# Patient Record
Sex: Female | Born: 1935 | Race: White | Hispanic: No | Smoking: Never smoker
Health system: Southern US, Community
[De-identification: ages and names within clinical notes are randomized; demographics above are authoritative.]

## PROBLEM LIST (undated history)

## (undated) DIAGNOSIS — G459 Transient cerebral ischemic attack, unspecified: Secondary | ICD-10-CM

## (undated) DIAGNOSIS — E785 Hyperlipidemia, unspecified: Secondary | ICD-10-CM

## (undated) DIAGNOSIS — F419 Anxiety disorder, unspecified: Secondary | ICD-10-CM

## (undated) DIAGNOSIS — F0280 Dementia in other diseases classified elsewhere without behavioral disturbance: Secondary | ICD-10-CM

## (undated) DIAGNOSIS — G309 Alzheimer's disease, unspecified: Secondary | ICD-10-CM

## (undated) DIAGNOSIS — I10 Essential (primary) hypertension: Secondary | ICD-10-CM

## (undated) HISTORY — PX: CHOLECYSTECTOMY: SHX55

## (undated) HISTORY — PX: ABDOMINAL HYSTERECTOMY: SHX81

## (undated) HISTORY — PX: APPENDECTOMY: SHX54

---

## 1997-09-20 ENCOUNTER — Ambulatory Visit (HOSPITAL_COMMUNITY): Admission: RE | Admit: 1997-09-20 | Discharge: 1997-09-21 | Payer: Self-pay | Admitting: Cardiology

## 2004-02-24 ENCOUNTER — Ambulatory Visit: Payer: Self-pay | Admitting: *Deleted

## 2006-10-23 ENCOUNTER — Ambulatory Visit: Payer: Self-pay | Admitting: *Deleted

## 2007-08-31 ENCOUNTER — Ambulatory Visit: Payer: Self-pay | Admitting: Urology

## 2007-11-16 ENCOUNTER — Ambulatory Visit: Payer: Self-pay | Admitting: Family Medicine

## 2008-03-28 ENCOUNTER — Ambulatory Visit: Payer: Self-pay | Admitting: Family Medicine

## 2008-04-26 ENCOUNTER — Encounter: Payer: Self-pay | Admitting: Dermatology

## 2008-05-16 ENCOUNTER — Emergency Department: Payer: Self-pay | Admitting: Emergency Medicine

## 2008-05-23 ENCOUNTER — Encounter: Payer: Self-pay | Admitting: Dermatology

## 2008-06-20 ENCOUNTER — Encounter: Payer: Self-pay | Admitting: Dermatology

## 2008-09-01 ENCOUNTER — Ambulatory Visit: Payer: Self-pay | Admitting: Family Medicine

## 2008-09-22 ENCOUNTER — Emergency Department: Payer: Self-pay | Admitting: Internal Medicine

## 2008-09-23 ENCOUNTER — Ambulatory Visit: Payer: Self-pay | Admitting: Family Medicine

## 2009-02-04 IMAGING — US US EXTREM LOW VENOUS*R*
1 series · 17 of 23 positions shown · non-contrast
Comparison: none

REASON FOR EXAM: pain and swelling in lower leg
COMMENTS:

PROCEDURE:     US  - US DOPPLER LOW EXTR RIGHT  - May 16, 2008  [DATE]
RESULT:     Comparison: 03/28/2008

[Series 1: us extrem low venous*right* · 17 of 23 slices shown]
[im 1/23]
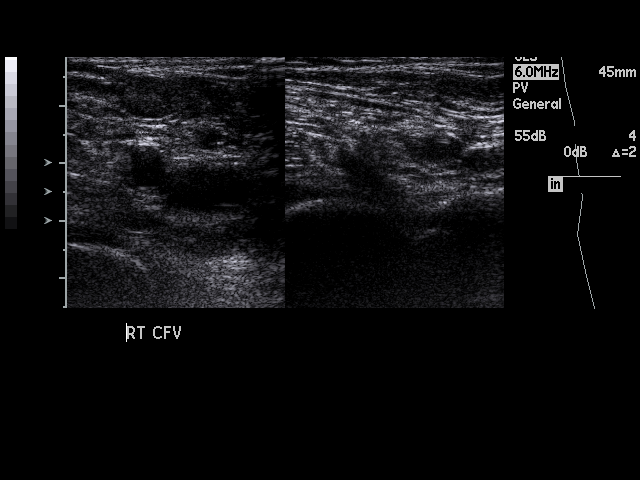
[im 3/23]
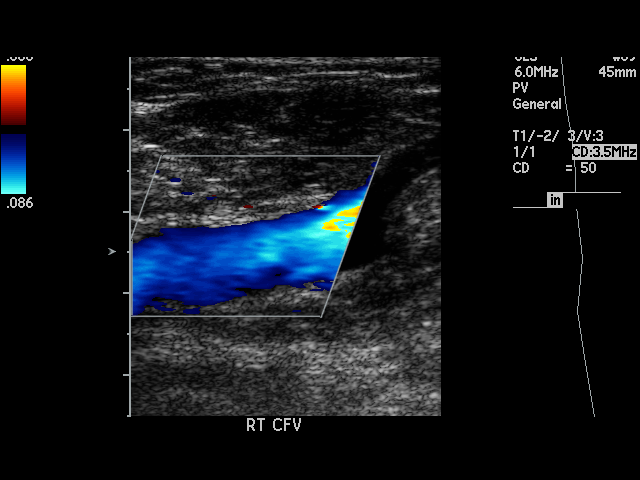
[im 4/23]
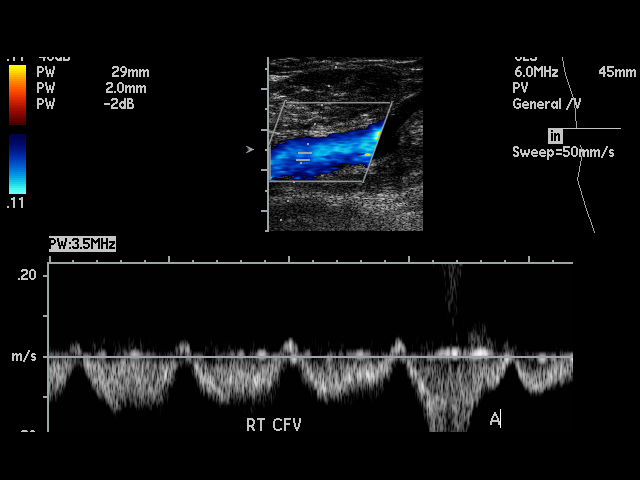
[im 5/23]
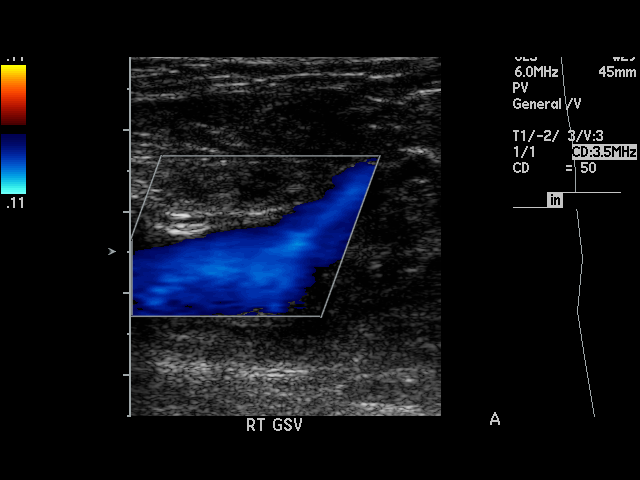
[im 7/23]
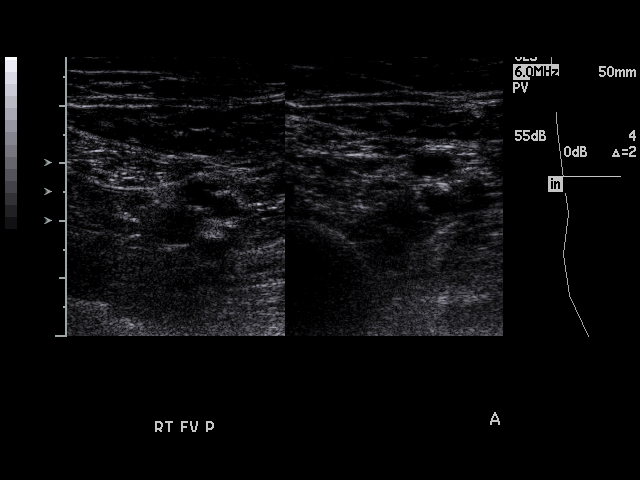
[im 8/23]
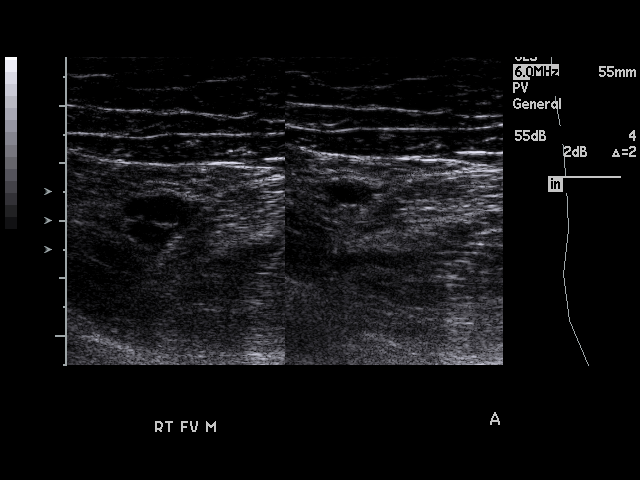
[im 9/23]
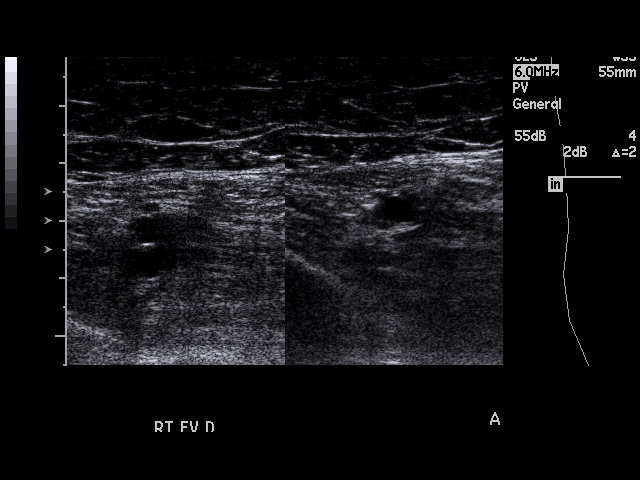
[im 11/23]
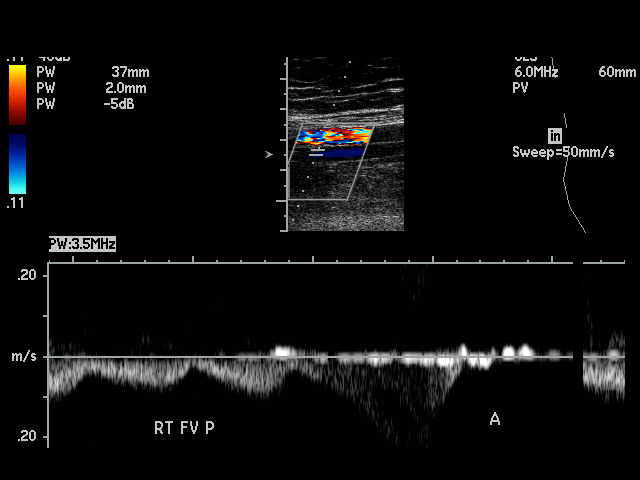
[im 12/23]
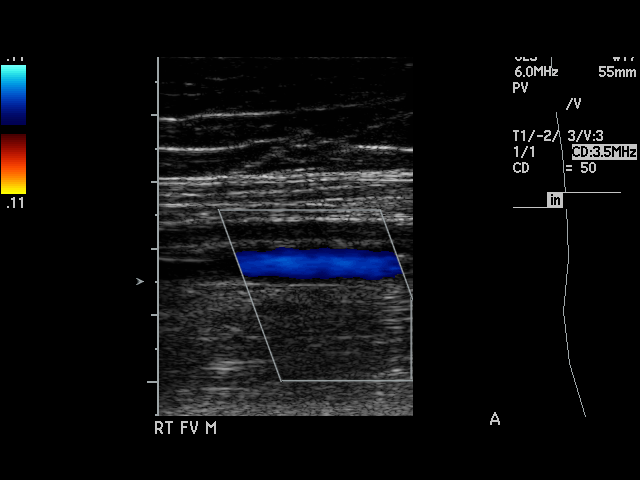
[im 13/23]
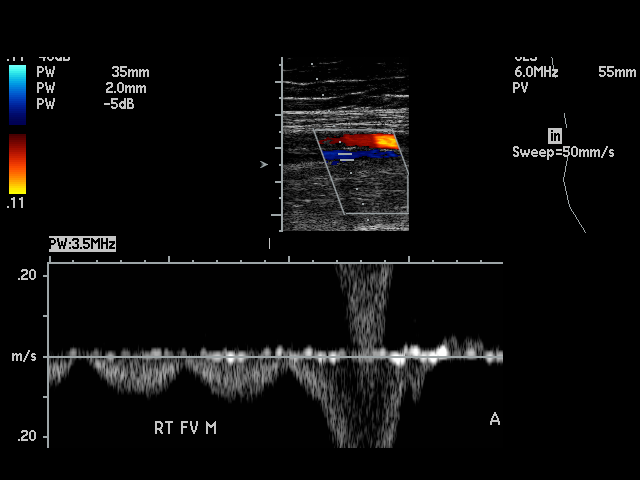
[im 15/23]
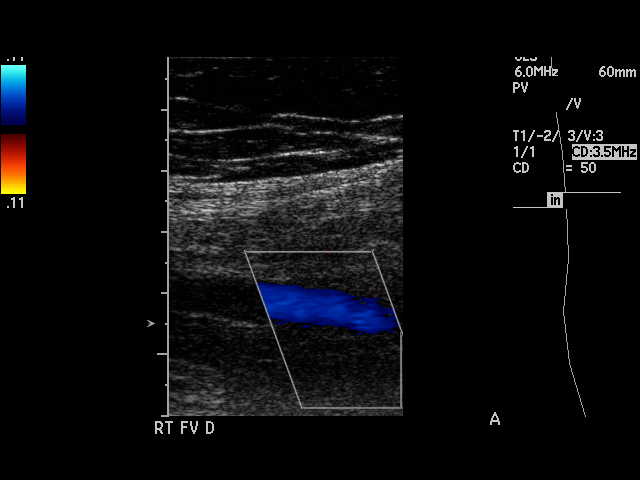
[im 16/23]
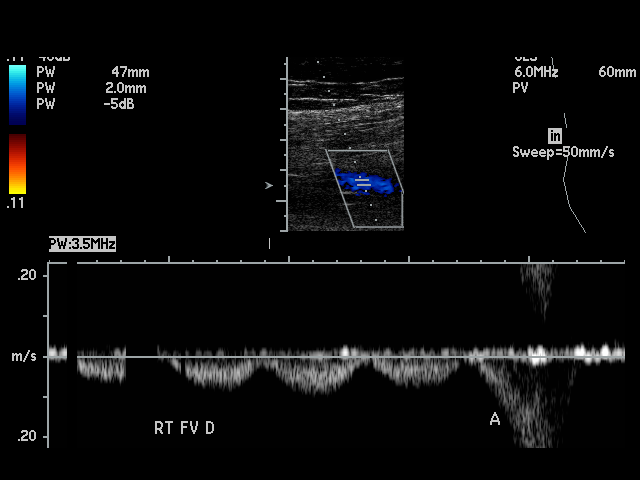
[im 17/23]
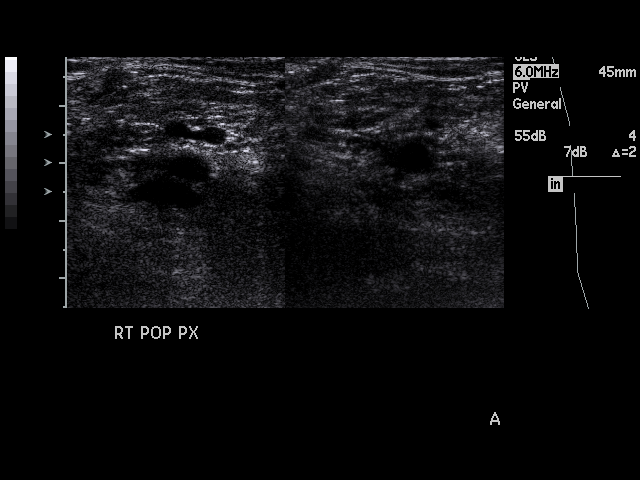
[im 19/23]
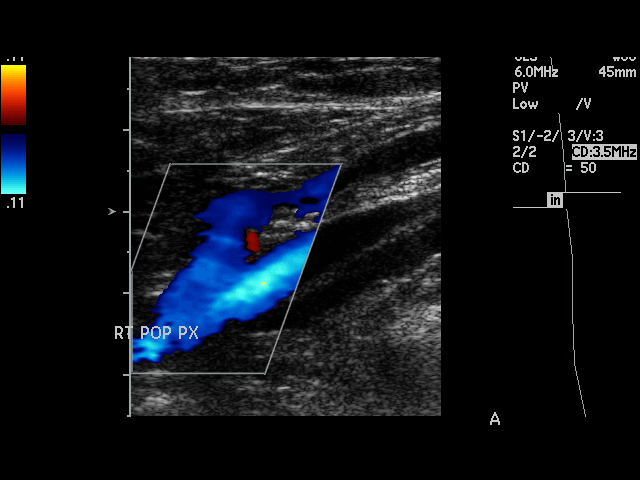
[im 20/23]
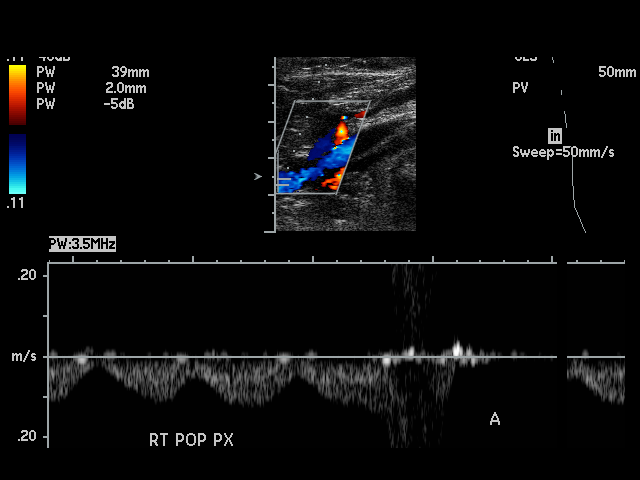
[im 21/23]
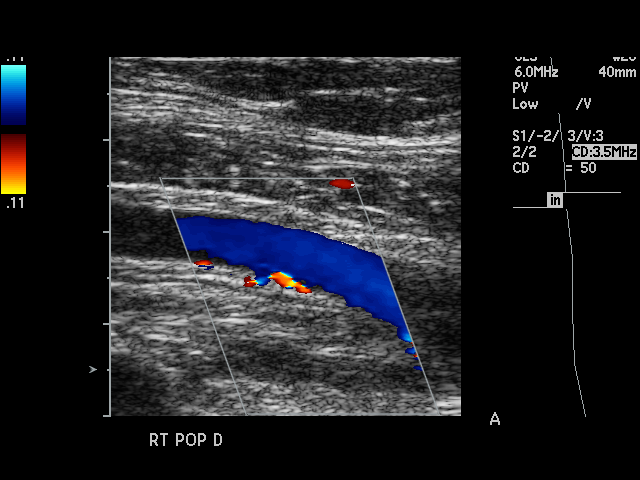
[im 23/23]
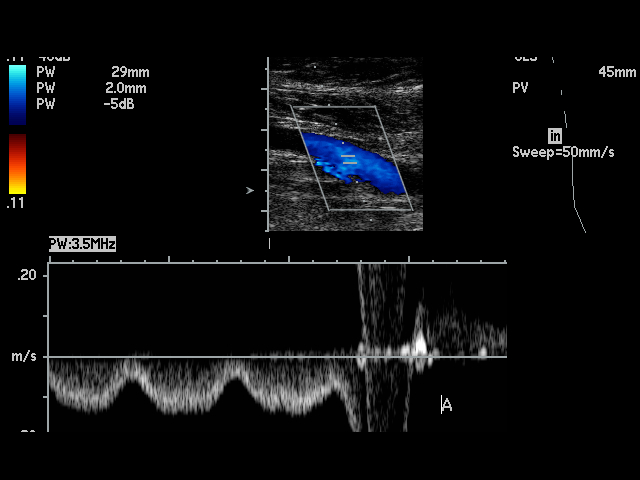

[17 of 23 positions shown; findings below may reference images not displayed]

FINDINGS: Multiple longitudinal and transverse gray-scale as well as color
and spectral Doppler images of the right lower extremity veins were obtained
from the common femoral veins through the popliteal veins.

The right common femoral, greater saphenous, femoral, popliteal veins, and
venous trifurcation are patent, demonstrating normal color-flow and
compressibility. No intraluminal thrombus is identified.There is normal
respiratory variation and augmentation demonstrated at all vein levels.
IMPRESSION: No evidence of DVT in the right lower extremity.

## 2010-04-19 ENCOUNTER — Emergency Department: Payer: Self-pay | Admitting: Emergency Medicine

## 2010-06-13 ENCOUNTER — Ambulatory Visit: Payer: Self-pay | Admitting: Nurse Practitioner

## 2010-06-19 ENCOUNTER — Ambulatory Visit: Payer: Self-pay | Admitting: Nurse Practitioner

## 2010-06-19 ENCOUNTER — Ambulatory Visit: Payer: Self-pay | Admitting: Emergency Medicine

## 2010-06-26 LAB — PATHOLOGY REPORT

## 2010-11-22 DIAGNOSIS — K219 Gastro-esophageal reflux disease without esophagitis: Secondary | ICD-10-CM | POA: Insufficient documentation

## 2011-09-28 DIAGNOSIS — R109 Unspecified abdominal pain: Secondary | ICD-10-CM | POA: Insufficient documentation

## 2011-11-26 ENCOUNTER — Observation Stay: Payer: Self-pay | Admitting: Internal Medicine

## 2011-11-26 LAB — URINALYSIS, COMPLETE
Ph: 8 (ref 4.5–8.0)
Protein: NEGATIVE
RBC,UR: 1 /HPF (ref 0–5)
Squamous Epithelial: 2

## 2011-11-26 LAB — COMPREHENSIVE METABOLIC PANEL
Albumin: 3.6 g/dL (ref 3.4–5.0)
Alkaline Phosphatase: 107 U/L (ref 50–136)
Anion Gap: 11 (ref 7–16)
BUN: 13 mg/dL (ref 7–18)
Calcium, Total: 9.6 mg/dL (ref 8.5–10.1)
Chloride: 107 mmol/L (ref 98–107)
EGFR (Non-African Amer.): >60
Glucose: 99 mg/dL (ref 65–99)
Osmolality: 281 (ref 275–301)
Potassium: 3.5 mmol/L (ref 3.5–5.1)
SGOT(AST): 48 U/L — ABNORMAL HIGH (ref 15–37)
Sodium: 141 mmol/L (ref 136–145)
Total Protein: 7.9 g/dL (ref 6.4–8.2)

## 2011-11-26 LAB — CBC
HGB: 13.4 g/dL (ref 12.0–16.0)
MCH: 30.6 pg (ref 26.0–34.0)
Platelet: 313 10*3/uL (ref 150–440)
RBC: 4.38 10*6/uL (ref 3.80–5.20)
RDW: 14.5 % (ref 11.5–14.5)
WBC: 9.3 10*3/uL (ref 3.6–11.0)

## 2011-11-26 LAB — CK TOTAL AND CKMB (NOT AT ARMC): CK-MB: <0.5 ng/mL — ABNORMAL LOW (ref 0.5–3.6)

## 2011-11-27 LAB — BASIC METABOLIC PANEL
Calcium, Total: 9 mg/dL (ref 8.5–10.1)
Chloride: 109 mmol/L — ABNORMAL HIGH (ref 98–107)
Co2: 24 mmol/L (ref 21–32)
Creatinine: 0.7 mg/dL (ref 0.60–1.30)
EGFR (African American): >60
Potassium: 3.1 mmol/L — ABNORMAL LOW (ref 3.5–5.1)
Sodium: 142 mmol/L (ref 136–145)

## 2011-11-27 LAB — CK TOTAL AND CKMB (NOT AT ARMC)
CK, Total: 49 U/L (ref 21–215)
CK-MB: 0.6 ng/mL (ref 0.5–3.6)
CK-MB: 0.7 ng/mL (ref 0.5–3.6)

## 2011-11-27 LAB — CBC WITH DIFFERENTIAL/PLATELET
Basophil #: 0.1 10*3/uL (ref 0.0–0.1)
Eosinophil #: 0 10*3/uL (ref 0.0–0.7)
MCH: 30.3 pg (ref 26.0–34.0)
MCHC: 34.2 g/dL (ref 32.0–36.0)
Monocyte #: 0.9 x10 3/mm (ref 0.2–0.9)
Neutrophil %: 62.6 %
Platelet: 273 10*3/uL (ref 150–440)
RDW: 14.1 % (ref 11.5–14.5)

## 2011-11-27 LAB — TROPONIN I: Troponin-I: <0.02 ng/mL

## 2013-02-12 DIAGNOSIS — F0280 Dementia in other diseases classified elsewhere without behavioral disturbance: Secondary | ICD-10-CM | POA: Insufficient documentation

## 2013-07-26 DIAGNOSIS — I340 Nonrheumatic mitral (valve) insufficiency: Secondary | ICD-10-CM | POA: Insufficient documentation

## 2013-07-26 DIAGNOSIS — I071 Rheumatic tricuspid insufficiency: Secondary | ICD-10-CM | POA: Insufficient documentation

## 2013-08-16 ENCOUNTER — Ambulatory Visit: Payer: Self-pay | Admitting: Gastroenterology

## 2013-08-30 ENCOUNTER — Ambulatory Visit: Payer: Self-pay | Admitting: Gastroenterology

## 2013-09-01 LAB — PATHOLOGY REPORT

## 2013-09-20 ENCOUNTER — Ambulatory Visit: Payer: Self-pay | Admitting: Gastroenterology

## 2014-08-09 NOTE — Discharge Summary (Signed)
PATIENT NAME:  Lynn Jimenez, Lynn Jimenez MR#:  [id number] DATE OF BIRTH:  [birth year]  DATE OF ADMISSION:  11/26/2011 DATE OF DISCHARGE:  11/27/2011  ADMITTING DIAGNOSIS: Worsening dizziness.   DISCHARGE DIAGNOSES:  1. Dizziness, possible benign positional vertigo. Patient has had dizziness for the past one year. Magnetic resonance imaging was negative. Needs further evaluation with ear, nose throat as outpatient.  2. Chest pain with reproducible component. Had a negative stress test in 2011. Cardiac enzymes x3 negative. Electrocardiogram without any acute changes.  3. History of coronary artery disease with myocardial infarction.  4. Hypertension.  5. History of dementia.  6. Gastroesophageal reflux disease.   7. Depression.  8. Hyperlipidemia.  9. Anxiety.   LABORATORY, DIAGNOSTIC AND RADIOLOGICAL DATA: Urinalysis showed trace bacteria, trace leukocytes. CT scan of the head showed no acute abnormality. EKG showed no ST-T wave changes. Troponin less than 0.02, CK-MB less than 0.5, CPK 50, troponin x3 was negative. WBC 9.3, hemoglobin 13.4, platelet count 313, sodium 141, potassium 3.5, chloride 107, bicarbonate 23, BUN 13, creatinine 0.70, glucose 99. LFTs were normal. Chest x-ray showed no acute abnormality. MRI of the brain showed no mass, no lesions. Chronic white matter ischemic changes. Bilateral carotid ultrasound showed plaque in the internal carotid on the right demonstrating less than 50% stenosis. Plaque also on the left internal carotid with less than 50% stenosis.   HOSPITAL COURSE: Please refer to history and physical done by the admitting physician. Patient is a 79 year old white female with history of hypertension, dementia, has had dizziness ongoing for about one year. Has been seen by ENT as outpatient. Patient presented to her primary care with these symptoms. She was also having some left-sided chest pain which was reproducible in nature, was sent to ED for evaluation. She was placed under  observation for further evaluation of her symptoms in terms of her dizziness. Patient had a carotid ultrasound results as above. Had MRI of her brain which was negative. She still has some symptoms. She needs to follow up outpatient with ENT. Patient has been started on meclizine as needed for dizziness. In terms of her chest pain, was very atypical in nature. She has had a stress test negative in 2011. She had reproducible pain. Her cardiac enzymes, EKG showed no acute abnormality. If she continues to experience chest pain needs outpatient evaluation with her cardiologist. At this time patient is stable and will need further evaluation with her primary Jimenez.D.   Kendell BaneISCHARGE MEDICATIONS:  1. Lorazepam 0.5, 1 tab p.o. t.i.d. as needed.  2. Meclizine 12.5, 1 tab p.o. q.8 p.r.n.  3. Imdur 30 daily.  4. Amantadine 10 daily.   5. Metoprolol tartrate 25, 1 tab p.o. b.i.d.  6. Nitrostat as needed.  7. Protonix 40, 1 tab p.o. b.i.d.  8. Effexor-XR 37.5, 1 tab p.o. daily.  9. Aspirin 81, 1 tab p.o. daily.  10. Lipitor 40 daily.  11. Celexa 20 daily.  12. Hydrochlorothiazide 25 p.o. daily.  13. Astelin one spray nasal once daily.  HOME OXYGEN: None.   DIET: Low sodium.   ACTIVITY: As tolerated.   FOLLOW UP: Follow up with primary Jimenez.D., Dr. Rolm GalaHeidi Grandis, in 1 to 2 weeks. Follow up with ENT in 2 to 4 weeks. Patient already has an ENT physician that she has seen in the past.  TIME SPENT: 30 minutes.   ____________________________ Lacie ScottsShreyang H. Allena KatzPatel, MD shp:cms D: 11/27/2011 14:10:33 ET T: 11/28/2011 10:03:15 ET  JOB#: [id number] cc: Sherrina Zaugg H. Allena KatzPatel, MD, <Dictator> Heidi  Lorin Glass, MD Charise Carwin MD ELECTRONICALLY SIGNED 11/28/2011 13:44

## 2014-08-09 NOTE — H&P (Signed)
PATIENT NAME:  Lynn HorsemanRAINEY, Lynn M MR#:  [id number] DATE OF BIRTH:  04/03/1936  DATE OF ADMISSION:  11/26/2011  PRIMARY CARE PHYSICIAN: Dr. Rolm GalaHeidi Grandis  ER PHYSICIAN: Dr. Carollee MassedKaminski   CHIEF COMPLAINT: Dizziness.   HISTORY OF PRESENT ILLNESS: Patient is a 79 year old female patient with history of hypertension, dementia, came in because of dizziness. [facility] [place] and she was sent here. Patient says that dizziness is going on for about a year but gotten worse recently. She feels dizzy more when she gets up and moves her head and when she lies down on the bed without turning her head she doesn't  feel that much. She just feels funny in her head but unable to tell me what it is. Denies any blurred vision and feels some headache. Patient also denies any weakness in hands or legs. Did not pass out so dizziness is really waxing and waning type. Denies any ear pain or recent ear infection and sometimes she feels like nauseous and feels swimmy headed. No aggravating or relieving factor for the dizziness. Patient complained of some left-sided chest pain to the ER physician. We were asked to admit for chest pain and also vertigo. Patient denies any trouble breathing.   PAST MEDICAL HISTORY:  1. History of coronary artery disease and MI before. 2. Hypertension. 3. History of dementia.  4. Gastroesophageal reflux disease. 5. Depression. 6. Hyperlipidemia. 7. Anxiety.   ALLERGIES: She is allergic to sulfa.    SOCIAL HISTORY: No smoking. No drinking. Has a caregiver at home.   PAST SURGICAL HISTORY: No history of previous surgeries.    MEDICATIONS:  1. Aspirin 81 mg daily.  2. Lipitor 40 mg daily. 3. Astelin 137 mcg spray daily.  4. Celexa 20 mg daily. 5. HCTZ 1 tablet 25 mg p.o. daily.  6. Metoprolol 25 mg p.o. b.i.d.  7. Namenda 10 mg p.o. daily.  8. Ativan 1 mg half to 1 tablet up to 3 times a day as needed. 9. Imdur 30 mg p.o. daily.  10. Nitroglycerin  0.4 mg sublingual p.r.n. for chest pain.  11. Pantoprazole 40 mg p.o. daily.  12. Venlafaxine 37.5 mg p.o. daily.  13. Reglan 10 mg p.o. as needed.   REVIEW OF SYSTEMS: CONSTITUTIONAL: No fever. No fatigue. EYES: No blurred vision. Patient has no epistaxis. No difficulty swallowing. RESPIRATORY: No cough. No wheezing. CARDIOVASCULAR: Pain on the left side around the breast area but denies any trouble breathing. Patient has no orthopnea, PND, no pedal edema. GASTROINTESTINAL: No nausea. No vomiting. No abdominal pain. GENITOURINARY: No dysuria. ENDOCRINE: No polyuria, nocturia. INTEGUMENT: No skin rashes. MUSCULOSKELETAL: No joint pain. NEUROLOGIC: Feels dizziness but no weakness or dysarthria. History of dementia present.    PHYSICAL EXAMINATION:  VITAL SIGNS: Temperature 97 Fahrenheit, pulse 76, respirations 20, blood pressure 140/91, sats 97% on room air.   GENERAL: 79 year old female patient answering questions appropriately, has no distress.   HEENT: Head atraumatic, normocephalic. Pupils equally reacting to light. Extraocular movements are intact. No conjunctivitis. No scleral icterus. Hearing is intact. No oropharyngeal erythema. Tympanic membranes are clear.   NECK: Thyroid nontender. No nodules are observed. Supple. No lymphadenopathy. No JVD. No carotid bruits.   RESPIRATORY: Clear to auscultation. No wheeze. No rales.   CARDIOVASCULAR: S1 and S2 regular. No murmurs. Patient has no carotid bruit on neck examination.   ABDOMEN: Soft, nontender, nondistended. Bowel sounds present.   EXTREMITIES: No extremity edema. Strength 5/5 upper and lower extremities.  SKIN: No skin rashes.   LYMPH NODES: No lymphadenopathy in cervical or axillary region.   NEUROLOGIC: Cranial nerves intact. Deep tendon reflexes 2+ bilaterally. No dysarthria or aphasia. Sensations are intact. Power 5/5 in upper and lower extremities.   PSYCH: Alert, oriented to time, place, person.   LABORATORY,  DIAGNOSTIC AND RADIOLOGICAL DATA: Urinalysis is yellow color hazy urine, trace bacteria and trace leukocyte esterase. CT head showed no CT evidence of acute intracranial abnormality, probably chronic small vessel ischemic changes with mild atrophy, no significant interval changes. Troponin less than 0.02. CPK-MB less than 0.5. CK total 50. WBC 9.3, hemoglobin 13.4, hematocrit 39, platelets 313. Electrolytes: Sodium 141, potassium 3.5, chloride 107, bicarbonate 23, BUN 13, creatinine 0.70, glucose 99. LFTs within normal limits. Chest x-ray showed no acute cardiopulmonary disease.   EKG compared to the one at [facility] and the one in the ER has no significant changes. Normal sinus rhythm and no ST-T changes.   ASSESSMENT AND PLAN: 79 year old female with:  1. Dizziness mostly positional but because of her age we need to rule out cerebellar stroke. We are going to get MRI of the brain along with carotid ultrasound and echocardiogram and put her on meclizine and see how she does and also check orthostatic vitals. Patient also will be seen by physical therapist regarding her ambulatory dysfunction. According to the son she is mostly sitting in the chair and has not been active and has been sedentary for almost one year.  2. Chest pain. Patient's troponin is negative. EKG no changes. Had a stress test in 2011 with normal stress test. Patient says that chest pain is not associated with trouble breathing, palpitations or nausea so high her troponin is also negative. Will cycle two more sets of troponins and follow the echocardiogram.  3. Urinary tract infection by urinalysis. Will start her on Cipro and just follow the cultures.  4. Hypertension. Continue her home medications.  5. For dementia she is on Namenda, continue that.  6. For hyperlipidemia she is on statins. Continue Lipitor 40 mg daily. We are going to stop the HCTZ because of her dizziness. 7. History of depression and anxiety and  dementia. Continue Celexa, Effexor and also Ativan.   TIME SPENT ON HISTORY AND PHYSICAL: About 60 minutes.   ____________________________ Katha Hamming, MD sk:cms D: 11/26/2011 20:44:58 ET T: 11/27/2011 07:02:22 ET JOB#: [id number]  cc: Katha Hamming, MD, <Dictator> Letitia Caul, MD Katha Hamming MD ELECTRONICALLY SIGNED 12/06/2011 13:17

## 2015-11-06 ENCOUNTER — Other Ambulatory Visit: Payer: Self-pay | Admitting: Family Medicine

## 2015-11-06 DIAGNOSIS — N644 Mastodynia: Secondary | ICD-10-CM

## 2015-11-06 DIAGNOSIS — Z1231 Encounter for screening mammogram for malignant neoplasm of breast: Secondary | ICD-10-CM

## 2015-11-06 DIAGNOSIS — R928 Other abnormal and inconclusive findings on diagnostic imaging of breast: Secondary | ICD-10-CM

## 2015-11-21 ENCOUNTER — Ambulatory Visit: Payer: Self-pay

## 2015-11-27 ENCOUNTER — Other Ambulatory Visit: Payer: Self-pay | Admitting: Family Medicine

## 2015-11-27 ENCOUNTER — Ambulatory Visit
Admission: RE | Admit: 2015-11-27 | Discharge: 2015-11-27 | Disposition: A | Discharge status: Home / Self Care | Payer: Medicare Other | Source: Ambulatory Visit | Attending: Family Medicine | Admitting: Family Medicine

## 2015-11-27 DIAGNOSIS — Z1231 Encounter for screening mammogram for malignant neoplasm of breast: Secondary | ICD-10-CM

## 2017-07-04 DIAGNOSIS — F3342 Major depressive disorder, recurrent, in full remission: Secondary | ICD-10-CM | POA: Insufficient documentation

## 2017-07-04 DIAGNOSIS — R4189 Other symptoms and signs involving cognitive functions and awareness: Secondary | ICD-10-CM | POA: Insufficient documentation

## 2017-07-30 DIAGNOSIS — G459 Transient cerebral ischemic attack, unspecified: Secondary | ICD-10-CM

## 2017-07-30 HISTORY — DX: Transient cerebral ischemic attack, unspecified: G45.9

## 2017-08-01 DIAGNOSIS — I639 Cerebral infarction, unspecified: Secondary | ICD-10-CM | POA: Insufficient documentation

## 2017-08-11 DIAGNOSIS — Z8673 Personal history of transient ischemic attack (TIA), and cerebral infarction without residual deficits: Secondary | ICD-10-CM | POA: Insufficient documentation

## 2017-08-29 ENCOUNTER — Other Ambulatory Visit: Payer: Self-pay

## 2017-08-29 ENCOUNTER — Encounter: Payer: Self-pay | Admitting: Emergency Medicine

## 2017-08-29 ENCOUNTER — Ambulatory Visit
Admission: EM | Admit: 2017-08-29 | Discharge: 2017-08-29 | Disposition: A | Discharge status: Home / Self Care | Payer: Medicare Other | Attending: Family Medicine | Admitting: Family Medicine

## 2017-08-29 DIAGNOSIS — N3090 Cystitis, unspecified without hematuria: Secondary | ICD-10-CM | POA: Diagnosis not present

## 2017-08-29 DIAGNOSIS — R350 Frequency of micturition: Secondary | ICD-10-CM | POA: Diagnosis not present

## 2017-08-29 DIAGNOSIS — R351 Nocturia: Secondary | ICD-10-CM | POA: Diagnosis not present

## 2017-08-29 DIAGNOSIS — R300 Dysuria: Secondary | ICD-10-CM | POA: Diagnosis not present

## 2017-08-29 HISTORY — DX: Essential (primary) hypertension: I10

## 2017-08-29 HISTORY — DX: Hyperlipidemia, unspecified: E78.5

## 2017-08-29 HISTORY — DX: Transient cerebral ischemic attack, unspecified: G45.9

## 2017-08-29 HISTORY — DX: Anxiety disorder, unspecified: F41.9

## 2017-08-29 HISTORY — DX: Dementia in other diseases classified elsewhere without behavioral disturbance: F02.80

## 2017-08-29 HISTORY — DX: Alzheimer's disease, unspecified: G30.9

## 2017-08-29 LAB — URINALYSIS, COMPLETE (UACMP) WITH MICROSCOPIC
BILIRUBIN URINE: NEGATIVE
GLUCOSE, UA: NEGATIVE mg/dL
Ketones, ur: NEGATIVE mg/dL
NITRITE: NEGATIVE
PH: 8.5 — AB (ref 5.0–8.0)
Protein, ur: 100 mg/dL — AB
Specific Gravity, Urine: 1.015 (ref 1.005–1.030)

## 2017-08-29 MED ORDER — CEPHALEXIN 500 MG PO CAPS: 500.0000 mg | ORAL_CAPSULE | Freq: Two times a day (BID) | ORAL | 0 refills | Status: DC

## 2017-08-29 NOTE — Discharge Instructions (Signed)
Antibiotic as prescribed.  Take care  Dr. Alvera Tourigny  

## 2017-08-29 NOTE — ED Provider Notes (Signed)
[facility]    CSN: [id number] Arrival date & time: 08/29/17  0911  History   Chief Complaint Chief Complaint  Patient presents with  . Dysuria   HPI  82 year old female who presents for suspected UTI.  Caregiver is the primary historian due to dementia.  Caregiver reports that she had urinary frequency yesterday and also had nocturia last night.  She states that this is often the first sign that she has urinary tract infection.  She states that she had one time where the patient reported dysuria.  No reports of hematuria.  No reports of abdominal pain.  No fever.  No known exacerbating relieving factors.  No other associated symptoms.  No other complaints.  PMH: Esophageal reflux 11/22/2010   Depression    Hyperlipidemia    Hypertension    CAD (coronary artery disease)    Dementia    Anxiety  chronic benzo use.  Alzheimer's dementia 02/12/2013   Mitral insufficiency 07/26/2013 Moderate per Gwen Pounds 4/15  Tricuspid insufficiency 07/26/2013   HH (hiatus hernia) 08/16/2013   Chronic gastritis 08/16/2013    Surgical Hx: BTL      HYSTERECTOMY      CHOLECYSTECTOMY      APPENDECTOMY      VEIN LIGATION AND STRIPPING      COLONOSCOPY 08/16/2013  Entire examined colon is normal/Repeat 83yrs/MUS   EGD 08/16/2013  Chronic gastritis    OB History   None    Home Medications    Prior to Admission medications   Medication Sig Start Date End Date Taking? Authorizing Provider  aspirin EC 81 MG tablet Take 1 tablet by mouth daily.   Yes [provider]  atorvastatin (LIPITOR) 40 MG tablet Take 1 tablet by mouth daily. 08/02/17 09/01/17 Yes [provider]  citalopram (CELEXA) 20 MG tablet Take 1 tablet by mouth daily. 07/04/17 07/05/18 Yes [provider]  donepezil (ARICEPT) 10 MG tablet Take 1 tablet by mouth daily. 06/17/16  Yes [provider]  isosorbide mononitrate (IMDUR) 30 MG 24 hr tablet Take 1 tablet  by mouth daily. 07/04/17 04/02/18 Yes [provider]  LORazepam (ATIVAN) 1 MG tablet Take 1 tablet by mouth 2 (two) times daily as needed. 08/11/17  Yes [provider]  memantine (NAMENDA) 10 MG tablet Take 1 tablet by mouth 2 (two) times daily. 07/04/17 07/05/18 Yes [provider]  metoprolol tartrate (LOPRESSOR) 25 MG tablet Take 1 tablet by mouth 2 (two) times daily. 07/04/17 07/04/18 Yes [provider]  omeprazole (PRILOSEC) 20 MG capsule Take 1 capsule by mouth daily. 07/04/17 07/04/18 Yes [provider]  cephALEXin (KEFLEX) 500 MG capsule Take 1 capsule (500 mg total) by mouth 2 (two) times daily. 08/29/17   Tommie Sams, DO  nitroGLYCERIN (NITROSTAT) 0.4 MG SL tablet Place under the tongue. 07/04/17 07/04/18  [provider]   Family History Family History  Problem Relation Age of Onset  . Dementia Mother   . Other Mother        murdered  . Heart disease Father   . Breast cancer Neg Hx    Social History Social History   Tobacco Use  . Smoking status: Never Smoker  . Smokeless tobacco: Never Used  Substance Use Topics  . Alcohol use: Never    Frequency: Never  . Drug use: Never   Allergies   Sulfa antibiotics  Review of Systems Review of Systems  Constitutional: Negative.   Gastrointestinal: Negative.   Genitourinary: Positive for  dysuria and frequency.   Physical Exam Triage Vital Signs ED Triage Vitals  Enc Vitals Group     BP 08/29/17 0933 137/68     Pulse Rate 08/29/17 0933 62     Resp 08/29/17 0933 16     Temp 08/29/17 0933 (!) 97.5 F (36.4 C)     Temp Source 08/29/17 0933 Oral     SpO2 08/29/17 0933 99 %     Weight 08/29/17 0933 162 lb (73.5 kg)     Height 08/29/17 0933  (1.676 m)     Head Circumference --      Peak Flow --      Pain Score 08/29/17 0936 0     Pain Loc --      Pain Edu? --      Excl. in GC? --    Updated Vital Signs BP 137/68 (BP Location: Left Arm)   Pulse 62   Temp (!) 97.5  F (36.4 C) (Oral)   Resp 16   Ht  (1.676 m)   Wt 162 lb (73.5 kg)   SpO2 99%   BMI 26.15 kg/m     Physical Exam  Constitutional: She appears well-developed. No distress.  HENT:  Head: Normocephalic and atraumatic.  Nose: Nose normal.  Cardiovascular: Normal rate and regular rhythm.  Pulmonary/Chest: Effort normal and breath sounds normal.  Abdominal: Soft. She exhibits no distension. There is no tenderness.  Neurological: She is alert.  Psychiatric: Her behavior is normal.  Flat affect.  Nursing note and vitals reviewed.  UC Treatments / Results  Labs (all labs ordered are listed, but only abnormal results are displayed) Labs Reviewed  URINALYSIS, COMPLETE (UACMP) WITH MICROSCOPIC - Abnormal; Notable for the following components:      Result Value   APPearance CLOUDY (*)    pH 8.5 (*)    Hgb urine dipstick TRACE (*)    Protein, ur 100 (*)    Leukocytes, UA LARGE (*)    Bacteria, UA FEW (*)    All other components within normal limits  URINE CULTURE    EKG None  Radiology No results found.  Procedures Procedures (including critical care time)  Medications Ordered in UC Medications - No data to display  Initial Impression / Assessment and Plan / UC Course  I have reviewed the triage vital signs and the nursing notes.  Pertinent labs & imaging results that were available during my care of the patient were reviewed by me and considered in my medical decision making (see chart for details).    82 year old female presents with urinary tract infection.  Treating with Keflex.  Sending culture.  Final Clinical Impressions(s) / UC Diagnoses   Final diagnoses:  Cystitis     Discharge Instructions     Antibiotic as prescribed.  Take care  Dr. Adriana Simas     ED Prescriptions    Medication Sig Dispense Auth. Provider   cephALEXin (KEFLEX) 500 MG capsule Take 1 capsule (500 mg total) by mouth 2 (two) times daily. 14 capsule Tommie Sams, DO      Controlled Substance Prescriptions [place] Controlled Substance Registry consulted? Not Applicable   Tommie Sams, DO 08/29/17 1203

## 2017-08-29 NOTE — ED Triage Notes (Signed)
Patient in today with her caregiver c/o urinary frequency and slight dysuria x 1 day. Patient's caregiver denies fever.

## 2017-08-31 LAB — URINE CULTURE: Culture: >=100000 — AB

## 2017-11-30 ENCOUNTER — Ambulatory Visit
Admission: EM | Admit: 2017-11-30 | Discharge: 2017-11-30 | Disposition: A | Discharge status: Home / Self Care | Payer: Medicare Other | Attending: Family Medicine | Admitting: Family Medicine

## 2017-11-30 ENCOUNTER — Ambulatory Visit (INDEPENDENT_AMBULATORY_CARE_PROVIDER_SITE_OTHER): Payer: Medicare Other

## 2017-11-30 DIAGNOSIS — H9209 Otalgia, unspecified ear: Secondary | ICD-10-CM | POA: Diagnosis not present

## 2017-11-30 DIAGNOSIS — R51 Headache: Secondary | ICD-10-CM | POA: Diagnosis not present

## 2017-11-30 DIAGNOSIS — R519 Headache, unspecified: Secondary | ICD-10-CM

## 2017-11-30 DIAGNOSIS — R300 Dysuria: Secondary | ICD-10-CM

## 2017-11-30 LAB — URINALYSIS, COMPLETE (UACMP) WITH MICROSCOPIC
GLUCOSE, UA: NEGATIVE mg/dL
Ketones, ur: NEGATIVE mg/dL
NITRITE: NEGATIVE
PROTEIN: 30 mg/dL — AB
Specific Gravity, Urine: >1.03 — ABNORMAL HIGH (ref 1.005–1.030)
pH: 5.5 (ref 5.0–8.0)

## 2017-11-30 MED ORDER — CEPHALEXIN 500 MG PO CAPS: 500.0000 mg | ORAL_CAPSULE | Freq: Two times a day (BID) | ORAL | 0 refills | Status: AC

## 2017-11-30 NOTE — ED Triage Notes (Signed)
As per caregiver confused onset 3 days and also has some ear pain and Headache onset yesterday.

## 2017-11-30 NOTE — Discharge Instructions (Addendum)
Take medication as prescribed. Rest. Drink plenty of fluids.   Follow up with your primary care physician this week as discussed. Return to Urgent care or Emergency room for new or worsening concerns.

## 2017-11-30 NOTE — ED Provider Notes (Signed)
[facility] ____________________________________________  Time seen: Approximately 9:37 AM  I have reviewed the triage vital signs and the nursing notes.   HISTORY  Chief Complaint Urinary Tract Infection  HPI Lynn Jimenez is a 82 y.o. female presenting for evaluation of urinary burning complaints last night and today.  History is obtained from caregiver.  History limited due to Alzheimer dementia.  Caregiver reports this weekend started Friday night patient has been acting somewhat differently.  States that she has been more irritable and irritable towards her.  States that she did complain of a headache left lower head yesterday morning, no other complaints of headache.  Reports that patient has frequent aches and pains complaints on a recurrent basis and does complain of headache.  States last night and this morning with burning with urination and some urinary frequency, and further stating she has frequent UTIs.  Patient denies any pain at this time besides low back pain, and per caregiver this is chronic.  Denies fevers, vomiting, diarrhea, abdominal pain.  Denies paresthesias, fall, head trauma, weakness, facial droop.  Reports patient does have some chronic baseline intermittent confusion, without change.  No visual changes.  Reports patient has been normal today except for dysuria.  Denies chest pain, shortness of breath, abdominal pain, extremity pain, extremity swelling, recent sickness or recent antibiotic use.  Caregiver reports patient had TIA in April of this year and [facility] and had unremarkable CT.     Past Medical History:  Diagnosis Date  . Alzheimer disease   . Anxiety   . Hyperlipidemia   . Hypertension   . TIA (transient ischemic attack) 07/30/2017  via care everywhere Esophageal reflux 11/22/2010   Depression    Hyperlipidemia    Hypertension    CAD (coronary artery disease)    Dementia    Anxiety  chronic benzo use.    Alzheimer's dementia 02/12/2013   Mitral insufficiency 07/26/2013 Moderate per Gwen Poundskowalski 4/15  Tricuspid insufficiency 07/26/2013   HH (hiatus hernia) 08/16/2013   Chronic gastritis 08/16/2013      There are no active problems to display for this patient.   Past Surgical History:  Procedure Laterality Date  . ABDOMINAL HYSTERECTOMY    . APPENDECTOMY    . CHOLECYSTECTOMY       No current facility-administered medications for this encounter.   Current Outpatient Medications:  .  aspirin EC 81 MG tablet, Take 1 tablet by mouth daily., Disp: , Rfl:  .  citalopram (CELEXA) 20 MG tablet, Take 1 tablet by mouth daily., Disp: , Rfl:  .  donepezil (ARICEPT) 10 MG tablet, Take 1 tablet by mouth daily., Disp: , Rfl:  .  isosorbide mononitrate (IMDUR) 30 MG 24 hr tablet, Take 1 tablet by mouth daily., Disp: , Rfl:  .  LORazepam (ATIVAN) 1 MG tablet, Take 1 tablet by mouth 2 (two) times daily as needed., Disp: , Rfl:  .  memantine (NAMENDA) 10 MG tablet, Take 1 tablet by mouth 2 (two) times daily., Disp: , Rfl:  .  metoprolol tartrate (LOPRESSOR) 25 MG tablet, Take 1 tablet by mouth 2 (two) times daily., Disp: , Rfl:  .  nitroGLYCERIN (NITROSTAT) 0.4 MG SL tablet, Place under the tongue., Disp: , Rfl:  .  omeprazole (PRILOSEC) 20 MG capsule, Take 1 capsule by mouth daily., Disp: , Rfl:  .  atorvastatin (LIPITOR) 40 MG tablet, Take 1 tablet by mouth daily., Disp: , Rfl:  .  cephALEXin (KEFLEX) 500 MG capsule, Take  1 capsule (500 mg total) by mouth 2 (two) times daily., Disp: 14 capsule, Rfl: 0 .  cephALEXin (KEFLEX) 500 MG capsule, Take 1 capsule (500 mg total) by mouth 2 (two) times daily for 7 days., Disp: 14 capsule, Rfl: 0  Allergies Sulfa antibiotics  Family History  Problem Relation Age of Onset  . Dementia Mother   . Other Mother        murdered  . Heart disease Father   . Breast cancer Neg Hx     Social History Social History   Tobacco Use  . Smoking status: Never Smoker   . Smokeless tobacco: Never Used  Substance Use Topics  . Alcohol use: Never    Frequency: Never  . Drug use: Never    Review of Systems Constitutional: No fever Eyes: No visual changes. Cardiovascular: Denies chest pain. Respiratory: Denies shortness of breath. Gastrointestinal: No abdominal pain.  No nausea, no vomiting.  No diarrhea.  No constipation. Genitourinary: positive for dysuria. Musculoskeletal: positive intermittent back pain, per caregiver chronic.  Skin: Negative for rash. Neurological: Negative for focal weakness or numbness. As above.    ____________________________________________   PHYSICAL EXAM:  VITAL SIGNS: ED Triage Vitals  Enc Vitals Group     BP 11/30/17 0924 127/83     Pulse Rate 11/30/17 0924 (!) 59     Resp 11/30/17 0924 16     Temp 11/30/17 0924 (!) 97.5 F (36.4 C)     Temp Source 11/30/17 0924 Oral     SpO2 11/30/17 0924 99 %     Weight 11/30/17 0922 160 lb (72.6 kg)     Height 11/30/17 0922 5\' 6"  (1.676 m)     Head Circumference --      Peak Flow --      Pain Score 11/30/17 0922 5     Pain Loc --      Pain Edu? --      Excl. in GC? --     Constitutional: Alert. Well appearing and in no acute distress. Eyes: Conjunctivae are normal. PERRL. EOMI. ENT      Head: Normocephalic and atraumatic.      Ears: Nontender, normal canal, no erythema, normal TMs bilaterally.  No mastoid tenderness bilaterally.      Nose: No congestion/rhinnorhea.      Mouth/Throat: Mucous membranes are moist.Oropharynx non-erythematous. No tonsillar swelling or exudate.  Neck: No stridor. Supple without meningismus.  Hematological/Lymphatic/Immunilogical: No cervical lymphadenopathy. Cardiovascular: Normal rate, regular rhythm. Grossly normal heart sounds.  Good peripheral circulation. Respiratory: Normal respiratory effort without tachypnea nor retractions. Breath sounds are clear and equal bilaterally. No wheezes, rales, rhonchi. Gastrointestinal: Soft and  nontender. No CVA tenderness. Musculoskeletal:  Nontender with normal range of motion in all extremities. No midline cervical, thoracic or lumbar tenderness to palpation. Steady gait.  Bilateral hand grip strong and equal. Neurologic: Alert to person and place.  No paresthesias.  No facial droop.  No gross focal neurologic deficits are appreciated. Speech is normal.  Ambulatory with steady gait.  Skin:  Skin is warm, dry and intact. No rash noted. Psychiatric: Mood and affect are normal. Speech and behavior are normal. Patient exhibits appropriate insight and judgment   ___________________________________________   LABS (all labs ordered are listed, but only abnormal results are displayed)  Labs Reviewed  URINALYSIS, COMPLETE (UACMP) WITH MICROSCOPIC - Abnormal; Notable for the following components:      Result Value   APPearance HAZY (*)    Specific Gravity, Urine >1.030 (*)  Hgb urine dipstick MODERATE (*)    Bilirubin Urine SMALL (*)    Protein, ur 30 (*)    Leukocytes, UA TRACE (*)    Bacteria, UA RARE (*)    All other components within normal limits  URINE CULTURE    RADIOLOGY  Ct Head Wo Contrast  Result Date: 11/30/2017 CLINICAL DATA:  Headache, ear pain EXAM: CT HEAD WITHOUT CONTRAST TECHNIQUE: Contiguous axial images were obtained from the base of the skull through the vertex without intravenous contrast. COMPARISON:  None. FINDINGS: Brain: No evidence of acute infarction, hemorrhage, hydrocephalus, extra-axial collection or mass lesion/mass effect. Global cortical atrophy. Subcortical white matter and periventricular small vessel ischemic changes. Vascular: Intracranial atherosclerosis. Skull: Normal. Negative for fracture or focal lesion. Sinuses/Orbits: The visualized paranasal sinuses are essentially clear. The mastoid air cells are unopacified. Other: None. IMPRESSION: No evidence of acute intracranial abnormality. Atrophy with small vessel ischemic changes.  Electronically Signed   By: Charline Bills M.D.   On: 11/30/2017 10:30     Reviewed by care everywhere and August 01, 2017 at [facility], CT head with and without contrast as well as CT neck. Interface, Rad Results In - 08/01/2017  1:37 PM EDT EXAM: Computed tomography, head or brain without contrast material. DATE: 08/01/2017 12:30 PM ACCESSION: [id number] UN DICTATED: 08/01/2017 12:48 PM INTERPRETATION LOCATION: [facility]  CLINICAL INDICATION: 82 years old Female with STROKE-    COMPARISON: None  TECHNIQUE: Axial CT images from skull base to vertex without contrast.   FINDINGS: Findings consistent with chronic microvascular disease affecting the cerebral white matter are present. There is no midline shift or mass lesion.   There is no evidence of intracranial hemorrhage.  No evidence of acute infarct. No fractures are evident.  Visualized portions of the paranasal sinuses are clear. Mastoid air cells are clear.  IMPRESSION: -- No acute intracranial abnormality.  Interface, Rad Results In - 08/01/2017  2:06 PM EDT EXAM: Computed tomographic angiography, head, with contrast material, including noncontrast images, and image postprocessing. DATE: 08/01/2017 1:44 PM ACCESSION: [id number] UN DICTATED: 08/01/2017 1:50 PM INTERPRETATION LOCATION: [facility]  CLINICAL INDICATION: 82 years old Female with Code Stroke-    COMPARISON: 08/01/2017 CT  TECHNIQUE: CT angiogram of the head during contrast bolus infusion were acquired.  Reformatted sagittal, coronal MIP images are provided. 3D volume rendered images using a stand alone workstation are also provided.   FINDINGS: No intracranial hemorrhage, infarct or mass lesion.    Mild asymmetric narrowing of several patent right M2 branches (e.g. 604:46). Otherwise, the major arterial structures are normal in appearance.   IMPRESSION: --No large vessel occlusion. --Mild right M2 narrowing.   EXAM: Computed tomographic angiography, neck,  with contrast material, including noncontrast images, if performed, and image postprocessing on a stand alone workstation. DATE: 08/01/2017 1:44 PM  CLINICAL INDICATION: 82 years old Female with Code Stroke-    COMPARISON: None  TECHNIQUE: Contiguous axial CT angiogram of the neck during contrast bolus infusion were acquired.  Multiplanar reformatted and MIP images were provided. For selected cases, 3D volume rendered images are also provided.   IMPRESSION: Aortic and branch vessel atherosclerosis. No hemodynamically significant internal carotid stenosis. Moderate-marked ostial narrowing of the right external carotid artery. ____________________________________________   PROCEDURES Procedures   INITIAL IMPRESSION / ASSESSMENT AND PLAN / ED COURSE  Pertinent labs & imaging results that were available during my care of the patient were reviewed by me and considered in my medical decision making (see chart for details).  Overall well-appearing  patient.  No acute distress.  Presented with caregiver at bedside.  Patient with chronic dementia, patient does require some repetitive questioning, but does follow directions well.  Patient denies any pain at this time.  Presenting with dysuria complaints, and also report of headache yesterday morning per caregiver.  Patient denies any headache at this time.  No focal neurological deficits.  Will evaluate urinalysis as well as CT head.  CT head result as above per radiologist, no acute intracranial abnormality noted.  Urinalysis reviewed, discussed with patient and caregiver not clear UTI, as some contamination noted.  Concern for UTI.  We will culture urine and empirically started on oral Keflex.  Recommend close primary care follow-up in the next 3 to 4 days.  Discussed strict follow-up and return parameters and proceed directly to the emergency room for any worsening concerns.Discussed indication, risks and benefits of medications with patient and  caregiver.   Discussed follow up with Primary care physician this week. Discussed follow up and return parameters including no resolution or any worsening concerns. Caregiver and patient verbalized understanding and agreed to plan.   ____________________________________________   FINAL CLINICAL IMPRESSION(S) / ED DIAGNOSES  Final diagnoses:  Dysuria  Intermittent headache     ED Discharge Orders         Ordered    cephALEXin (KEFLEX) 500 MG capsule  2 times daily     11/30/17 1056           Note: This dictation was prepared with Dragon dictation along with smaller phrase technology. Any transcriptional errors that result from this process are unintentional.         Renford Dills, NP 11/30/17 1107

## 2017-12-02 LAB — URINE CULTURE

## 2018-01-09 ENCOUNTER — Ambulatory Visit (INDEPENDENT_AMBULATORY_CARE_PROVIDER_SITE_OTHER): Payer: Medicare Other

## 2018-01-09 ENCOUNTER — Ambulatory Visit
Admission: EM | Admit: 2018-01-09 | Discharge: 2018-01-09 | Disposition: A | Discharge status: Home / Self Care | Payer: Medicare Other | Attending: Emergency Medicine | Admitting: Emergency Medicine

## 2018-01-09 DIAGNOSIS — R531 Weakness: Secondary | ICD-10-CM | POA: Diagnosis not present

## 2018-01-09 DIAGNOSIS — R441 Visual hallucinations: Secondary | ICD-10-CM | POA: Diagnosis not present

## 2018-01-09 DIAGNOSIS — F419 Anxiety disorder, unspecified: Secondary | ICD-10-CM | POA: Insufficient documentation

## 2018-01-09 DIAGNOSIS — F32A Depression, unspecified: Secondary | ICD-10-CM | POA: Insufficient documentation

## 2018-01-09 DIAGNOSIS — F329 Major depressive disorder, single episode, unspecified: Secondary | ICD-10-CM | POA: Insufficient documentation

## 2018-01-09 DIAGNOSIS — F0390 Unspecified dementia without behavioral disturbance: Secondary | ICD-10-CM | POA: Insufficient documentation

## 2018-01-09 DIAGNOSIS — I2510 Atherosclerotic heart disease of native coronary artery without angina pectoris: Secondary | ICD-10-CM | POA: Insufficient documentation

## 2018-01-09 DIAGNOSIS — I10 Essential (primary) hypertension: Secondary | ICD-10-CM | POA: Insufficient documentation

## 2018-01-09 DIAGNOSIS — E785 Hyperlipidemia, unspecified: Secondary | ICD-10-CM | POA: Insufficient documentation

## 2018-01-09 DIAGNOSIS — R39198 Other difficulties with micturition: Secondary | ICD-10-CM | POA: Diagnosis not present

## 2018-01-09 LAB — CBC WITH DIFFERENTIAL/PLATELET
BASOS PCT: 1 %
Basophils Absolute: 0.1 10*3/uL (ref 0–0.1)
EOS PCT: 1 %
Eosinophils Absolute: 0 10*3/uL (ref 0–0.7)
HEMATOCRIT: 38.5 % (ref 35.0–47.0)
Hemoglobin: 13 g/dL (ref 12.0–16.0)
Lymphocytes Relative: 32 %
Lymphs Abs: 2.1 10*3/uL (ref 1.0–3.6)
MCH: 31.2 pg (ref 26.0–34.0)
MCHC: 33.6 g/dL (ref 32.0–36.0)
MCV: 92.8 fL (ref 80.0–100.0)
MONO ABS: 0.7 10*3/uL (ref 0.2–0.9)
MONOS PCT: 10 %
NEUTROS ABS: 3.7 10*3/uL (ref 1.4–6.5)
Neutrophils Relative %: 56 %
Platelets: 212 10*3/uL (ref 150–440)
RBC: 4.15 MIL/uL (ref 3.80–5.20)
RDW: 14.8 % — AB (ref 11.5–14.5)
WBC: 6.6 10*3/uL (ref 3.6–11.0)

## 2018-01-09 LAB — URINALYSIS, COMPLETE (UACMP) WITH MICROSCOPIC
Bacteria, UA: NONE SEEN
Glucose, UA: NEGATIVE mg/dL
Glucose, UA: NEGATIVE mg/dL
Hgb urine dipstick: NEGATIVE
Hgb urine dipstick: NEGATIVE
KETONES UR: NEGATIVE mg/dL
NITRITE: NEGATIVE
Nitrite: NEGATIVE
PH: 5.5 (ref 5.0–8.0)
PH: 5.5 (ref 5.0–8.0)
Protein, ur: 30 mg/dL — AB
Protein, ur: 30 mg/dL — AB
RBC / HPF: NONE SEEN RBC/hpf (ref 0–5)
RBC / HPF: NONE SEEN RBC/hpf (ref 0–5)
SPECIFIC GRAVITY, URINE: 1.025 (ref 1.005–1.030)
Specific Gravity, Urine: >1.03 — ABNORMAL HIGH (ref 1.005–1.030)

## 2018-01-09 LAB — BASIC METABOLIC PANEL
Anion gap: 14 (ref 5–15)
BUN: 13 mg/dL (ref 8–23)
CALCIUM: 8.4 mg/dL — AB (ref 8.9–10.3)
CO2: 21 mmol/L — AB (ref 22–32)
Chloride: 112 mmol/L — ABNORMAL HIGH (ref 98–111)
Creatinine, Ser: 0.5 mg/dL (ref 0.44–1.00)
GFR calc Af Amer: >60 mL/min (ref >60)
GFR calc non Af Amer: >60 mL/min (ref >60)
Glucose, Bld: 94 mg/dL (ref 70–99)
Potassium: 3.1 mmol/L — ABNORMAL LOW (ref 3.5–5.1)
Sodium: 147 mmol/L — ABNORMAL HIGH (ref 135–145)

## 2018-01-09 MED ORDER — CEPHALEXIN 500 MG PO CAPS: 500.0000 mg | ORAL_CAPSULE | Freq: Two times a day (BID) | ORAL | 0 refills | Status: AC

## 2018-01-09 NOTE — Discharge Instructions (Addendum)
We will call you if her lab results come back abnormal.  We are sending her urine off for culture to make sure that she has a urinary tract infection.  Call here in several days to get the results.  We may need to change her antibiotics or even discontinue them if she does not have a urinary tract infection.  Go immediately to the ER for the signs and symptoms we discussed.

## 2018-01-09 NOTE — ED Triage Notes (Addendum)
Caregiver reported pain with wiping and hallucinations that she states has gotten worse today. No fever. Reports of weakness and did bring in a sample from home. She does report that it takes a patient a long time to urinate.

## 2018-01-09 NOTE — ED Provider Notes (Signed)
HPI  SUBJECTIVE:  Lynn Jimenez is a 82 y.o. female who presents with visual hallucinations starting today.  Caregiver reports generalized weakness.  Caregiver denies global confusion.  Patient has been in her usual state of health up until today.  No change in her medicines.  She is taking all of her medications appropriately.  No vomiting, fevers, headaches.  She is eating and drinking well.  No chest pain, coughing, wheezing, shortness of breath.  No abdominal or back pain.  Patient has been complaining that it hurts when she wipes several times this week, but no dysuria, urgency, frequency, cloudy, odorous urine, hematuria.  No vaginal discharge, itching, odor, rash, irritation.  Patient is wearing depends.  No change in mental status.  Patient is answering questions appropriately.  No facial droop, arm or leg weakness, slurred speech.  No skin breakdown, sores, skin infection anywhere.  There are no aggravating or alleviating factors.  Caregiver has not tried anything for this.   Patient has been seen here twice. once in May for cystitis, and on August 11 for dysuria, and "acting differently".  Last visit she had a negative head CT because she was complaining of headache at that time, UA was not entirely consistent with UTI, but the patient was started on Keflex.  Urine culture grew out multiple species.   She was seen for urinary frequency by her PMD on 8/29 and she was treated with Cipro.  Urine culture grew out Klebsiella UTI that was sensitive to Cipro.  PMH TIA , alzheimer, HTN, hypercholesterolemia, UTI, anxiety.  No history of diabetes.  All history obtained through caregiver.  PMD: [place], [facility]   Past Medical History:  Diagnosis Date  . Alzheimer disease   . Anxiety   . Hyperlipidemia   . Hypertension   . TIA (transient ischemic attack) 07/30/2017    Past Surgical History:  Procedure Laterality Date  . ABDOMINAL HYSTERECTOMY    . APPENDECTOMY    . CHOLECYSTECTOMY       Family History  Problem Relation Age of Onset  . Dementia Mother   . Other Mother        murdered  . Heart disease Father   . Breast cancer Neg Hx     Social History   Tobacco Use  . Smoking status: Never Smoker  . Smokeless tobacco: Never Used  Substance Use Topics  . Alcohol use: Never    Frequency: Never  . Drug use: Never    No current facility-administered medications for this encounter.   Current Outpatient Medications:  .  aspirin EC 81 MG tablet, Take 1 tablet by mouth daily., Disp: , Rfl:  .  citalopram (CELEXA) 20 MG tablet, Take 1 tablet by mouth daily., Disp: , Rfl:  .  donepezil (ARICEPT) 10 MG tablet, Take 1 tablet by mouth daily., Disp: , Rfl:  .  isosorbide mononitrate (IMDUR) 30 MG 24 hr tablet, Take 1 tablet by mouth daily., Disp: , Rfl:  .  LORazepam (ATIVAN) 1 MG tablet, Take 1 tablet by mouth 2 (two) times daily as needed., Disp: , Rfl:  .  memantine (NAMENDA) 10 MG tablet, Take 1 tablet by mouth 2 (two) times daily., Disp: , Rfl:  .  metoprolol tartrate (LOPRESSOR) 25 MG tablet, Take 1 tablet by mouth 2 (two) times daily., Disp: , Rfl:  .  nitroGLYCERIN (NITROSTAT) 0.4 MG SL tablet, Place under the tongue., Disp: , Rfl:  .  omeprazole (PRILOSEC) 20 MG capsule, Take 1  capsule by mouth daily., Disp: , Rfl:  .  atorvastatin (LIPITOR) 40 MG tablet, Take 1 tablet by mouth daily., Disp: , Rfl:  .  cephALEXin (KEFLEX) 500 MG capsule, Take 1 capsule (500 mg total) by mouth 2 (two) times daily., Disp: 14 capsule, Rfl: 0  Allergies  Allergen Reactions  . Sulfa Antibiotics Hives     ROS  As noted in HPI.   Physical Exam  BP (!) 168/72 (BP Location: Left Arm)   Pulse 67   Temp 98.6 F (37 C) (Oral)   Resp 18   SpO2 95%   Constitutional: Well developed, well nourished, no acute distress Eyes: PERRL, EOMI, conjunctiva normal bilaterally HENT: Normocephalic, atraumatic,mucus membranes moist. TMs normal bilaterally.  No nasal congestion.  No sinus  tenderness.  Normal oropharynx. Neck: No cervical lymphadenopathy, meningismus Respiratory: Clear to auscultation bilaterally, no rales, no wheezing, no rhonchi Cardiovascular: Normal rate and rhythm, no murmurs, no gallops, no rubs GI: Soft, nondistended, normal bowel sounds, nontender, no rebound, no guarding Back: no CVAT skin: No rash, skin intact Musculoskeletal: No edema, no tenderness, no deformities Neurologic: Alert, responds to commands appropriately, CN II-XII grossly intact, no motor deficits, sensation grossly intact.  Patient ambulatory, moving all extremities equally.  No weakness Psychiatric: Speech and behavior appropriate   ED Course   Medications - No data to display  Orders Placed This Encounter  Procedures  . Urine culture    Standing Status:   Standing    Number of Occurrences:   1    Order Specific Question:   Patient immune status    Answer:   Normal  . DG Chest 2 View    Standing Status:   Standing    Number of Occurrences:   1    Order Specific Question:   Reason for Exam (SYMPTOM  OR DIAGNOSIS REQUIRED)    Answer:   R/O pna, EFFUSION, PULM EDEMA  . Urinalysis, Complete w Microscopic    Standing Status:   Standing    Number of Occurrences:   1  . Basic metabolic panel    Standing Status:   Standing    Number of Occurrences:   1  . CBC with Differential    Standing Status:   Standing    Number of Occurrences:   1  . Urinalysis, Complete w Microscopic    Standing Status:   Standing    Number of Occurrences:   1   Results for orders placed or performed during the hospital encounter of 01/09/18 (from the past 24 hour(s))  Urinalysis, Complete w Microscopic     Status: Abnormal   Collection Time: 01/09/18  3:37 PM  Result Value Ref Range   Color, Urine YELLOW YELLOW   APPearance CLEAR CLEAR   Specific Gravity, Urine >1.030 (H) 1.005 - 1.030   pH 5.5 5.0 - 8.0   Glucose, UA NEGATIVE NEGATIVE mg/dL   Hgb urine dipstick NEGATIVE NEGATIVE    Bilirubin Urine SMALL (A) NEGATIVE   Ketones, ur NEGATIVE NEGATIVE mg/dL   Protein, ur 30 (A) NEGATIVE mg/dL   Nitrite NEGATIVE NEGATIVE   Leukocytes, UA TRACE (A) NEGATIVE   Squamous Epithelial / LPF 0-5 0 - 5   WBC, UA 0-5 0 - 5 WBC/hpf   RBC / HPF NONE SEEN 0 - 5 RBC/hpf   Bacteria, UA NONE SEEN NONE SEEN   Urine-Other MICROSCOPIC EXAM PERFORMED ON UNCONCENTRATED URINE   Basic metabolic panel     Status: Abnormal   Collection  Time: 01/09/18  4:42 PM  Result Value Ref Range   Sodium 147 (H) 135 - 145 mmol/L   Potassium 3.1 (L) 3.5 - 5.1 mmol/L   Chloride 112 (H) 98 - 111 mmol/L   CO2 21 (L) 22 - 32 mmol/L   Glucose, Bld 94 70 - 99 mg/dL   BUN 13 8 - 23 mg/dL   Creatinine, Ser 1.610.50 0.44 - 1.00 mg/dL   Calcium 8.4 (L) 8.9 - 10.3 mg/dL   GFR calc non Af Amer >60 >60 mL/min   GFR calc Af Amer >60 >60 mL/min   Anion gap 14 5 - 15  CBC with Differential     Status: Abnormal   Collection Time: 01/09/18  4:42 PM  Result Value Ref Range   WBC 6.6 3.6 - 11.0 K/uL   RBC 4.15 3.80 - 5.20 MIL/uL   Hemoglobin 13.0 12.0 - 16.0 g/dL   HCT 09.638.5 04.535.0 - 40.947.0 %   MCV 92.8 80.0 - 100.0 fL   MCH 31.2 26.0 - 34.0 pg   MCHC 33.6 32.0 - 36.0 g/dL   RDW 81.114.8 (H) 91.411.5 - 78.214.5 %   Platelets 212 150 - 440 K/uL   Neutrophils Relative % 56 %   Neutro Abs 3.7 1.4 - 6.5 K/uL   Lymphocytes Relative 32 %   Lymphs Abs 2.1 1.0 - 3.6 K/uL   Monocytes Relative 10 %   Monocytes Absolute 0.7 0.2 - 0.9 K/uL   Eosinophils Relative 1 %   Eosinophils Absolute 0.0 0 - 0.7 K/uL   Basophils Relative 1 %   Basophils Absolute 0.1 0 - 0.1 K/uL  Urinalysis, Complete w Microscopic     Status: Abnormal   Collection Time: 01/09/18  4:42 PM  Result Value Ref Range   Color, Urine YELLOW YELLOW   APPearance HAZY (A) CLEAR   Specific Gravity, Urine 1.025 1.005 - 1.030   pH 5.5 5.0 - 8.0   Glucose, UA NEGATIVE NEGATIVE mg/dL   Hgb urine dipstick NEGATIVE NEGATIVE   Bilirubin Urine SMALL (A) NEGATIVE   Ketones, ur TRACE  (A) NEGATIVE mg/dL   Protein, ur 30 (A) NEGATIVE mg/dL   Nitrite NEGATIVE NEGATIVE   Leukocytes, UA SMALL (A) NEGATIVE   Squamous Epithelial / LPF 11-20 0 - 5   WBC, UA 21-50 0 - 5 WBC/hpf   RBC / HPF NONE SEEN 0 - 5 RBC/hpf   Bacteria, UA RARE (A) NONE SEEN   Dg Chest 2 View  Result Date: 01/09/2018 CLINICAL DATA:  Infection. EXAM: CHEST - 2 VIEW COMPARISON:  Chest x-ray dated November 26, 2011. FINDINGS: The patient is rotated to the right, somewhat limiting evaluation. The heart size and mediastinal contours are within normal limits. Normal pulmonary vascularity. No focal consolidation, pleural effusion, or pneumothorax. No acute osseous abnormality. IMPRESSION: No active cardiopulmonary disease. Electronically Signed   By: Obie DredgeWilliam T Derry M.D.   On: 01/09/2018 16:56    ED Clinical Impression  Hallucination, visual   ED Assessment/Plan   Devious records, labs reviewed.  As noted in HPI.Marland Kitchen. UA is not consistent with a UTI.  While she has trace leukocytes, she has no bacteria or nitrites.  Checking a CBC, BMP, chest x-ray.  The initial UA was run on a sample that was brought in by the patient's caregiver.  We will run a second sample here.  Notified by lab that they do not have running water temporarily and are unable to run the specimen immediately. Awaiting lab result.  Discussed delay with patient's caregiver.  Reviewed imaging independently.  No pneumonia. See radiology report for full details.  CBC normal.  Chest x-ray normal.  UA with rare bacteria, it is a contaminated sample, however we will send this off for culture.  Discussed the option of treating for possible UTI today with some Keflex, versus waiting for urine culture results and sensitivities.  Caregiver has opted to start Keflex today.  caregiver will call here in several days, and discontinue the Keflex if it comes back negative for urinary tract infection or we can change antibiotics based on the sensitivities.  The  caregiver will call her primary care physician and arrange close follow-up.  Caregiver and patient wishing to go home prior to BMP being resulted.  Feel that this is reasonable.  Will call them with any abnormal results.  Plan as above.  BMP reviewed.  Some borderline highs and lows, but nothing significantly deranged.   BUN and creatinine are normal.  Discussed labs, imaging, MDM, treatment plan, and plan for follow-up with caregiver. Discussed sn/sx that should prompt return to the ED.  Caregiver agrees with plan.   Meds ordered this encounter  Medications  . cephALEXin (KEFLEX) 500 MG capsule    Sig: Take 1 capsule (500 mg total) by mouth 2 (two) times daily.    Dispense:  14 capsule    Refill:  0    *This clinic note was created using Scientist, clinical (histocompatibility and immunogenetics). Therefore, there may be occasional mistakes despite careful proofreading.  ?   Domenick Gong, MD 01/09/18 1956

## 2018-01-11 LAB — URINE CULTURE: SPECIAL REQUESTS: NORMAL

## 2018-04-22 DEATH — deceased

## 2018-08-21 IMAGING — CT CT HEAD W/O CM
2 series · 15 of 30 positions shown, 17 images · non-contrast
Comparison: None.

CLINICAL DATA: Headache, ear pain

EXAM:
CT HEAD WITHOUT CONTRAST
TECHNIQUE: Contiguous axial images were obtained from the base of the skull
through the vertex without intravenous contrast.

[Series 2: head wo · axial · 0.41mm/px · z∈[-92,+23]mm · 7 of 31 slices shown, 9 images]
[im 4/31  brain]
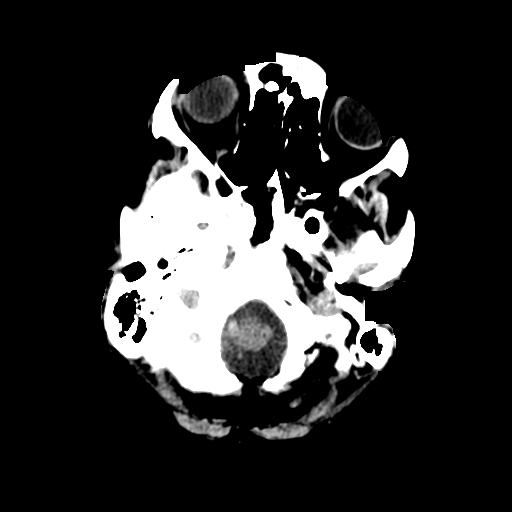
[im 4/31  bone]
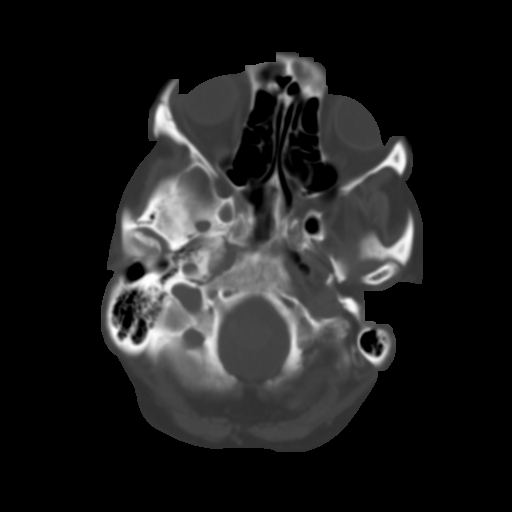
[im 8/31  brain]
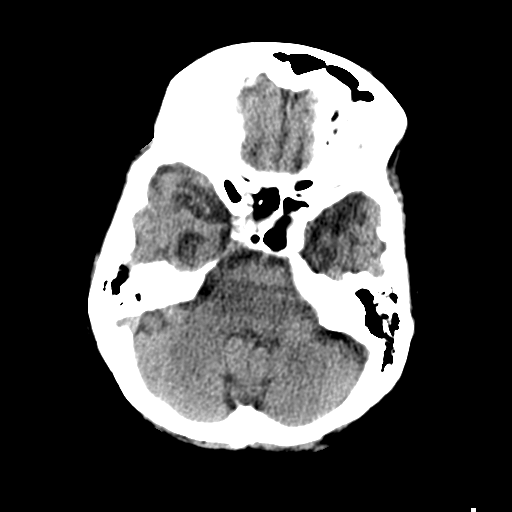
[im 12/31  brain]
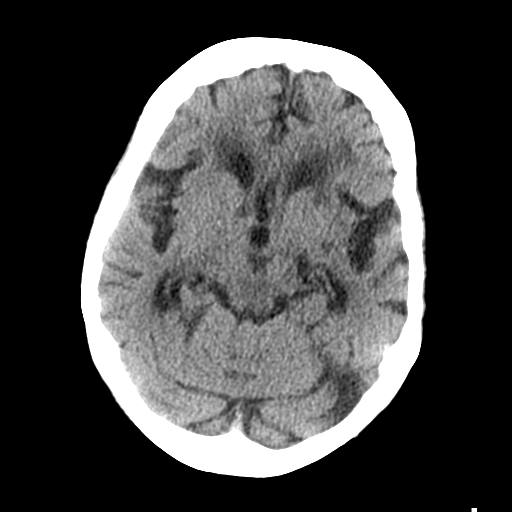
[im 16/31  brain]
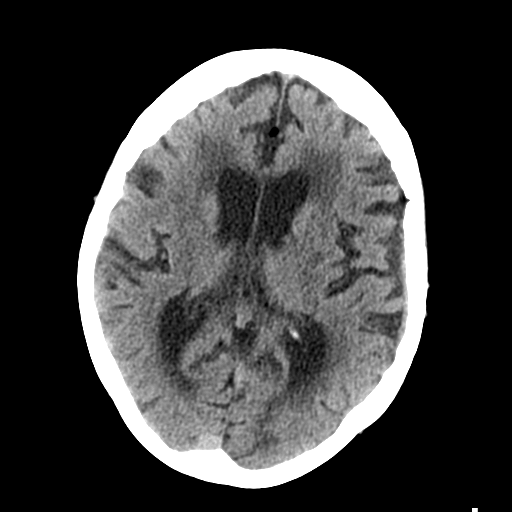
[im 19/31  brain]
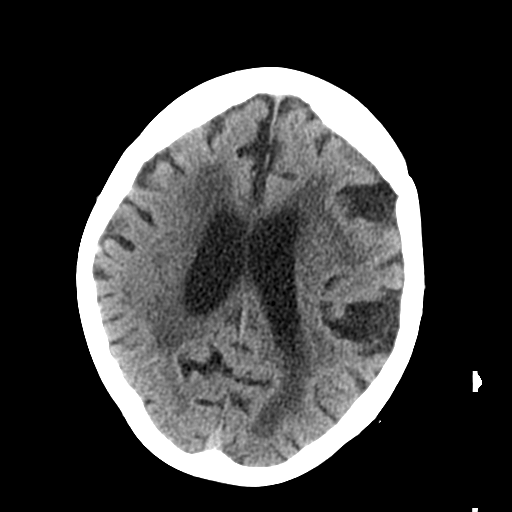
[im 19/31  bone]
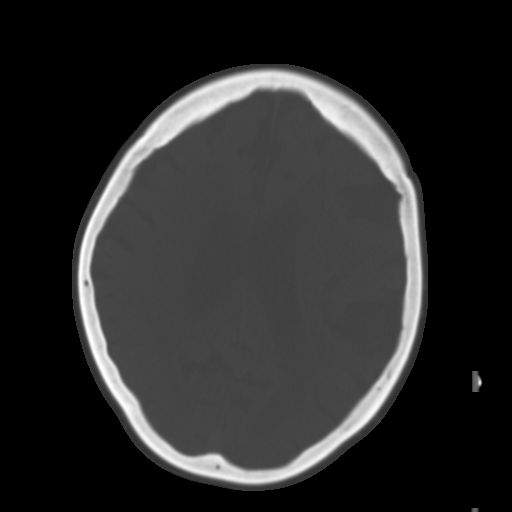
[im 23/31  brain]
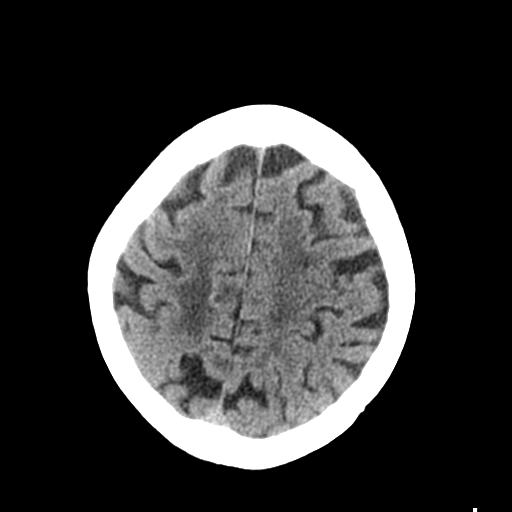
[im 27/31  brain]
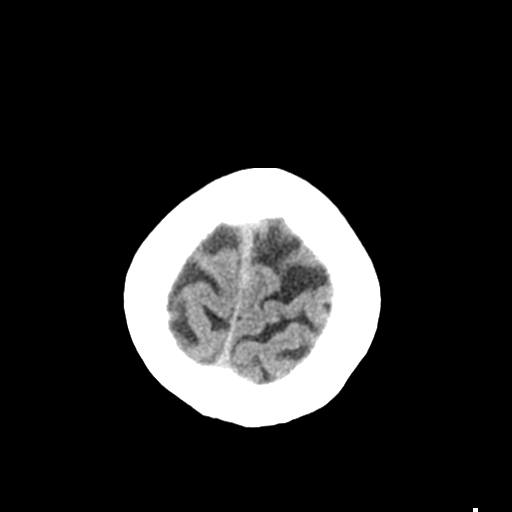

[Series 3: head bone · axial · 0.41mm/px · z∈[-93,+31]mm · 8 of 78 slices shown]
[im 8/78  bone]
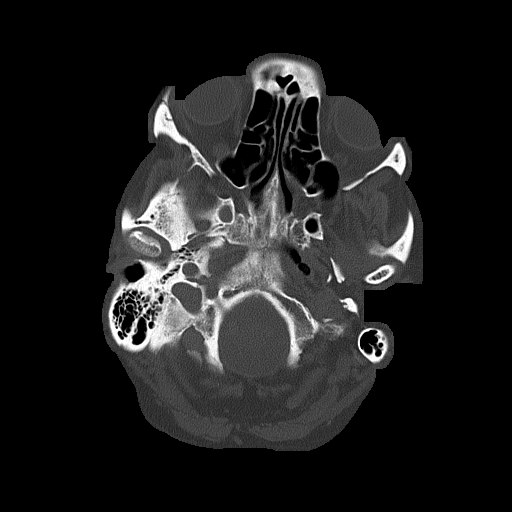
[im 16/78  bone]
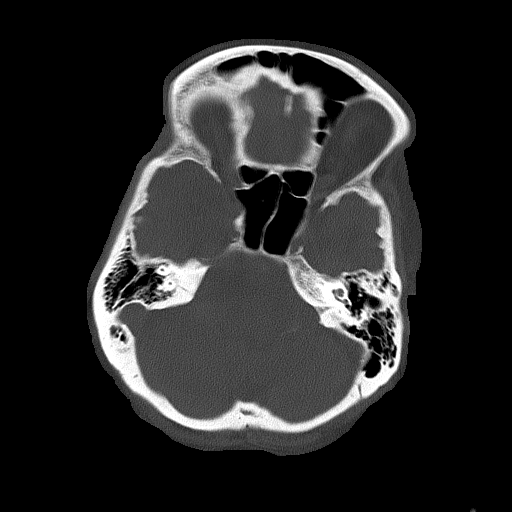
[im 24/78  bone]
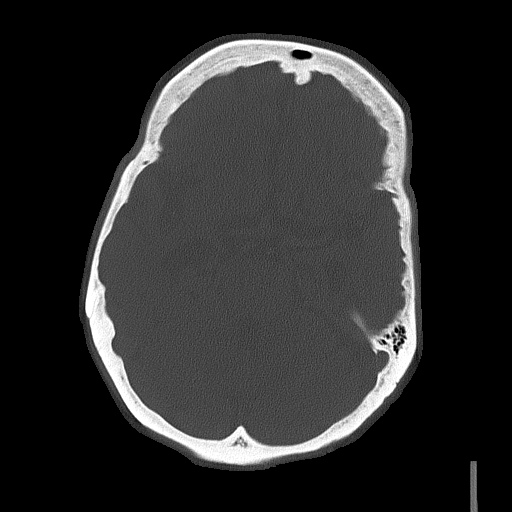
[im 35/78  bone]
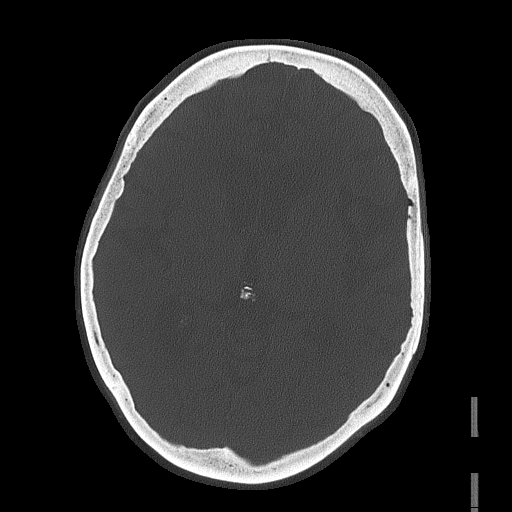
[im 43/78  bone]
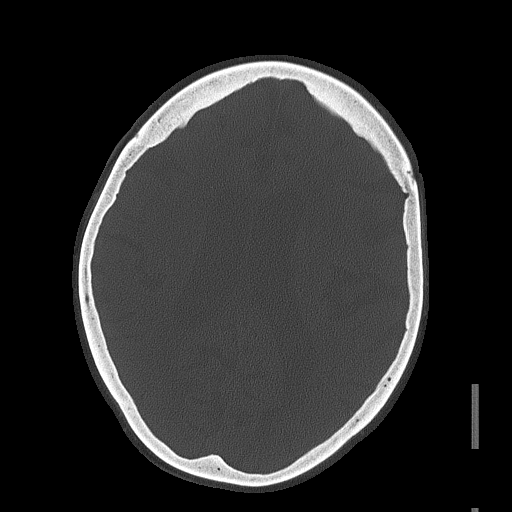
[im 54/78  bone]
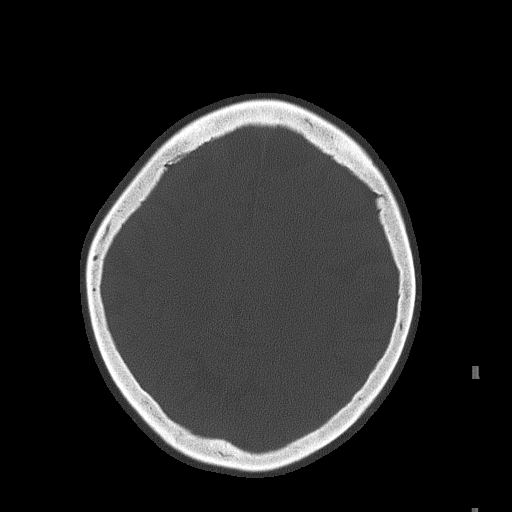
[im 62/78  bone]
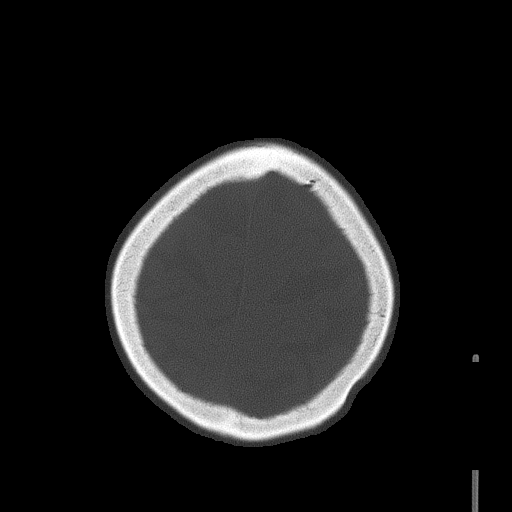
[im 70/78  bone]
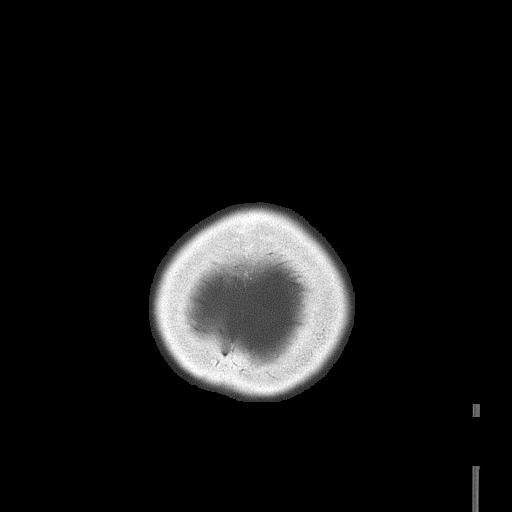

[15 of 30 positions shown; findings below may reference images not displayed]

FINDINGS: Brain: No evidence of acute infarction, hemorrhage, hydrocephalus,
extra-axial collection or mass lesion/mass effect.

Global cortical atrophy.

Subcortical white matter and periventricular small vessel ischemic
changes.

Vascular: Intracranial atherosclerosis.

Skull: Normal. Negative for fracture or focal lesion.

Sinuses/Orbits: The visualized paranasal sinuses are essentially
clear. The mastoid air cells are unopacified.

Other: None.
IMPRESSION: No evidence of acute intracranial abnormality.

Atrophy with small vessel ischemic changes.
